# Patient Record
Sex: Male | Born: 1950 | Race: White | Hispanic: No | Marital: Married | Smoking: Never smoker
Health system: Southern US, Community
[De-identification: ages and names within clinical notes are randomized; demographics above are authoritative.]

## PROBLEM LIST (undated history)

## (undated) DIAGNOSIS — N400 Enlarged prostate without lower urinary tract symptoms: Secondary | ICD-10-CM

## (undated) DIAGNOSIS — K5792 Diverticulitis of intestine, part unspecified, without perforation or abscess without bleeding: Secondary | ICD-10-CM

## (undated) DIAGNOSIS — I10 Essential (primary) hypertension: Secondary | ICD-10-CM

## (undated) DIAGNOSIS — C439 Malignant melanoma of skin, unspecified: Secondary | ICD-10-CM

## (undated) HISTORY — PX: HERNIA REPAIR: SHX51

---

## 2018-01-08 ENCOUNTER — Emergency Department (HOSPITAL_COMMUNITY)
Admission: EM | Admit: 2018-01-08 | Discharge: 2018-01-09 | Disposition: A | Discharge status: Home / Self Care | Payer: 59 | Attending: Emergency Medicine | Admitting: Emergency Medicine

## 2018-01-08 ENCOUNTER — Encounter (HOSPITAL_COMMUNITY): Payer: Self-pay | Admitting: Emergency Medicine

## 2018-01-08 ENCOUNTER — Other Ambulatory Visit: Payer: Self-pay

## 2018-01-08 DIAGNOSIS — E876 Hypokalemia: Secondary | ICD-10-CM | POA: Diagnosis not present

## 2018-01-08 DIAGNOSIS — R1013 Epigastric pain: Secondary | ICD-10-CM | POA: Diagnosis not present

## 2018-01-08 DIAGNOSIS — R748 Abnormal levels of other serum enzymes: Secondary | ICD-10-CM

## 2018-01-08 DIAGNOSIS — I10 Essential (primary) hypertension: Secondary | ICD-10-CM | POA: Insufficient documentation

## 2018-01-08 DIAGNOSIS — R1084 Generalized abdominal pain: Secondary | ICD-10-CM

## 2018-01-08 HISTORY — DX: Essential (primary) hypertension: I10

## 2018-01-08 HISTORY — DX: Malignant melanoma of skin, unspecified: C43.9

## 2018-01-08 HISTORY — DX: Diverticulitis of intestine, part unspecified, without perforation or abscess without bleeding: K57.92

## 2018-01-08 HISTORY — DX: Benign prostatic hyperplasia without lower urinary tract symptoms: N40.0

## 2018-01-08 LAB — CBC WITH DIFFERENTIAL/PLATELET
Abs Immature Granulocytes: 0 10*3/uL (ref 0.00–0.07)
BASOS ABS: 0 10*3/uL (ref 0.0–0.1)
BASOS PCT: 0 %
EOS PCT: 4 %
Eosinophils Absolute: 0.1 10*3/uL (ref 0.0–0.5)
HEMATOCRIT: 39.9 % (ref 39.0–52.0)
Hemoglobin: 13.4 g/dL (ref 13.0–17.0)
Immature Granulocytes: 0 %
LYMPHS ABS: 1.6 10*3/uL (ref 0.7–4.0)
Lymphocytes Relative: 42 %
MCH: 31.8 pg (ref 26.0–34.0)
MCHC: 33.6 g/dL (ref 30.0–36.0)
MCV: 94.5 fL (ref 80.0–100.0)
MONOS PCT: 9 %
Monocytes Absolute: 0.3 10*3/uL (ref 0.1–1.0)
NEUTROS PCT: 45 %
NRBC: 0 % (ref 0.0–0.2)
Neutro Abs: 1.7 10*3/uL (ref 1.7–7.7)
Platelets: 149 10*3/uL — ABNORMAL LOW (ref 150–400)
RBC: 4.22 MIL/uL (ref 4.22–5.81)
RDW: 12.6 % (ref 11.5–15.5)
WBC: 3.8 10*3/uL — ABNORMAL LOW (ref 4.0–10.5)

## 2018-01-08 LAB — URINALYSIS, ROUTINE W REFLEX MICROSCOPIC
Bilirubin Urine: NEGATIVE
Glucose, UA: NEGATIVE mg/dL
Hgb urine dipstick: NEGATIVE
KETONES UR: NEGATIVE mg/dL
Leukocytes, UA: NEGATIVE
Nitrite: NEGATIVE
PROTEIN: NEGATIVE mg/dL
Specific Gravity, Urine: 1.012 (ref 1.005–1.030)
pH: 6 (ref 5.0–8.0)

## 2018-01-08 NOTE — ED Provider Notes (Signed)
[facility] EMERGENCY DEPARTMENT Provider Note   CSN: [id number] Arrival date & time: 01/08/18  2203    History   Chief Complaint Chief Complaint  Patient presents with  . Abdominal Pain    HPI Tony Hughes is a 67 y.o. male who presents with abdominal pain. PMH significant for diverticulosis, HTN. Past surgical hx significant for bilateral inguinal hernia repair. The patient states that about 1 hour ago he started to have a pain around his middle. It felt tight "like a band". It was very uncomfortable and worried him so he decided to come to the ED. As soon as he got here and had an EKG done the pain eased off. He states that he had a similar pain a couple weeks ago which was milder. He had some nausea but not vomiting. He had a bowel movement and this did not improve his pain. He denies lightheadedness, chest pain, SOB, current abdominal pain, urinary symptoms.  HPI  Past Medical History:  Diagnosis Date  . Diverticulitis   . Hypertension     There are no active problems to display for this patient.   Past Surgical History:  Procedure Laterality Date  . HERNIA REPAIR          Home Medications    Prior to Admission medications   Not on File    Family History No family history on file.  Social History Social History   Tobacco Use  . Smoking status: Never Smoker  . Smokeless tobacco: Never Used  Substance Use Topics  . Alcohol use: Yes  . Drug use: Never     Allergies   Patient has no known allergies.   Review of Systems Review of Systems  Constitutional: Negative for fever.  Respiratory: Negative for shortness of breath.   Cardiovascular: Negative for chest pain.  Gastrointestinal: Positive for abdominal pain and nausea. Negative for constipation, diarrhea and vomiting.  Genitourinary: Negative for dysuria.  Neurological: Negative for syncope and light-headedness.  All other systems reviewed and are  negative.    Physical Exam Updated Vital Signs BP (!) 157/96   Pulse 61   Temp 98.5 F (36.9 C) (Oral)   Resp 16   SpO2 100%   Physical Exam  Constitutional: He is oriented to person, place, and time. He appears well-developed and well-nourished. No distress.  Cooperative. Mildly anxious  HENT:  Head: Normocephalic and atraumatic.  Eyes: Pupils are equal, round, and reactive to light. Conjunctivae are normal. Right eye exhibits no discharge. Left eye exhibits no discharge. No scleral icterus.  Neck: Normal range of motion.  Cardiovascular: Normal rate and regular rhythm.  Pulmonary/Chest: Effort normal and breath sounds normal. No respiratory distress.  Abdominal: Soft. Bowel sounds are normal. He exhibits no distension and no mass. There is no tenderness. There is no rebound and no guarding. No hernia.  Neurological: He is alert and oriented to person, place, and time.  Skin: Skin is warm and dry.  Psychiatric: He has a normal mood and affect. His behavior is normal.  Nursing note and vitals reviewed.    ED Treatments / Results  Labs (all labs ordered are listed, but only abnormal results are displayed) Labs Reviewed  CBC WITH DIFFERENTIAL/PLATELET - Abnormal; Notable for the following components:      Result Value   WBC 3.8 (*)    Platelets 149 (*)    All other components within normal limits  COMPREHENSIVE METABOLIC PANEL - Abnormal; Notable for the following components:  Potassium 3.2 (*)    Glucose, Bld 111 (*)    Calcium 8.8 (*)    Total Protein 6.1 (*)    AST 146 (*)    ALT 99 (*)    All other components within normal limits  LIPASE, BLOOD  URINALYSIS, ROUTINE W REFLEX MICROSCOPIC  TROPONIN I    EKG EKG Interpretation  Date/Time:  Friday January 08 2018 22:14:27 EST Ventricular Rate:  67 PR Interval:  172 QRS Duration: 100 QT Interval:  412 QTC Calculation: 435 R Axis:   29 Text Interpretation:  Sinus rhythm with Premature atrial complexes  Incomplete right bundle branch block Borderline ECG No old tracing to compare Confirmed by Deno Etienne [id number]) on 01/09/2018 1:16:06 AM   Radiology Ct Abdomen Pelvis W Contrast  Result Date: 01/09/2018 CLINICAL DATA:  Acute onset of epigastric abdominal pain and nausea. EXAM: CT ABDOMEN AND PELVIS WITH CONTRAST TECHNIQUE: Multidetector CT imaging of the abdomen and pelvis was performed using the standard protocol following bolus administration of intravenous contrast. CONTRAST:  123mL OMNIPAQUE IOHEXOL 300 MG/ML  SOLN COMPARISON:  None. FINDINGS: Lower chest: The visualized lung bases are grossly clear. The visualized portions of the mediastinum are unremarkable. Hepatobiliary: The liver is unremarkable in appearance. The gallbladder is unremarkable in appearance. The common bile duct remains normal in caliber. Pancreas: The pancreas is within normal limits. Spleen: The spleen is unremarkable in appearance. Adrenals/Urinary Tract: The adrenal glands are unremarkable in appearance. The kidneys are within normal limits. There is no evidence of hydronephrosis. No renal or ureteral stones are identified. No perinephric stranding is seen. Stomach/Bowel: Mild diverticulosis is noted along the descending and sigmoid colon, without evidence of diverticulitis. Vascular/Lymphatic: The abdominal aorta is unremarkable in appearance. The inferior vena cava is grossly unremarkable. No retroperitoneal lymphadenopathy is seen. No pelvic sidewall lymphadenopathy is identified. Reproductive: The bladder is mildly distended and grossly unremarkable. The prostate is enlarged, measuring 5.4 cm in transverse dimension. Other: No additional soft tissue abnormalities are seen. Musculoskeletal: No acute osseous abnormalities are identified. The visualized musculature is unremarkable in appearance. IMPRESSION: 1. No acute abnormality seen within the abdomen or pelvis. 2. Mild diverticulosis along the descending and sigmoid colon, without  evidence of diverticulitis. 3. Enlarged prostate noted. Electronically Signed   By: Garald Balding M.D.   On: 01/09/2018 01:08    Procedures Procedures (including critical care time)  Medications Ordered in ED Medications  iohexol (OMNIPAQUE) 300 MG/ML solution 100 mL (100 mLs Intravenous Contrast Given 01/09/18 0024)     Initial Impression / Assessment and Plan / ED Course  I have reviewed the triage vital signs and the nursing notes.  Pertinent labs & imaging results that were available during my care of the patient were reviewed by me and considered in my medical decision making (see chart for details).  67 year old male presents with generalized abdominal pain earlier tonight. He is hypertensive but otherwise vitals are normal. On my initial evaluation he is essentially pain free. EKG shows SR with PACs and RBBB. Will obtain labs, UA, and CT Abdomen/pelvis. Discussed with attending Dr. Zenia Resides.  CBC is remarkable for mild leukopenia. CMP is remarkable for mild hypokalemia, hypocalcemia, low protein, and mildly elevated liver enzymes. He states he drinks about a bottle of wine a week. No excessive Tylenol usage. No hx of hepatitis. I do not have a recent value to compare but he states he has a physical in September and blood work was normal at that time. CT was  negative for acute process. He was given a copy of his results and encouraged to f/u with his PCP. Return precautions given.   Final Clinical Impressions(s) / ED Diagnoses   Final diagnoses:  Generalized abdominal pain  Elevated liver enzymes  Hypokalemia    ED Discharge Orders    None       Recardo Evangelist, PA-C 01/09/18 [facility]    Lacretia Leigh, MD 01/09/18 [phone]

## 2018-01-08 NOTE — ED Triage Notes (Signed)
Pt c/o epigastric pain that started tonight. Endorses some nausea, denies vomiting/diarrhea. Hx diverticulitis and hernia repair.

## 2018-01-09 ENCOUNTER — Emergency Department (HOSPITAL_COMMUNITY): Payer: 59

## 2018-01-09 LAB — COMPREHENSIVE METABOLIC PANEL
ALT: 99 U/L — AB (ref 0–44)
AST: 146 U/L — AB (ref 15–41)
Albumin: 3.8 g/dL (ref 3.5–5.0)
Alkaline Phosphatase: 41 U/L (ref 38–126)
Anion gap: 8 (ref 5–15)
BILIRUBIN TOTAL: 0.7 mg/dL (ref 0.3–1.2)
BUN: 14 mg/dL (ref 8–23)
CHLORIDE: 105 mmol/L (ref 98–111)
CO2: 26 mmol/L (ref 22–32)
CREATININE: 1.01 mg/dL (ref 0.61–1.24)
Calcium: 8.8 mg/dL — ABNORMAL LOW (ref 8.9–10.3)
Glucose, Bld: 111 mg/dL — ABNORMAL HIGH (ref 70–99)
POTASSIUM: 3.2 mmol/L — AB (ref 3.5–5.1)
Sodium: 139 mmol/L (ref 135–145)
TOTAL PROTEIN: 6.1 g/dL — AB (ref 6.5–8.1)

## 2018-01-09 LAB — TROPONIN I

## 2018-01-09 LAB — LIPASE, BLOOD: LIPASE: 34 U/L (ref 11–51)

## 2018-01-09 MED ORDER — IOHEXOL 300 MG/ML  SOLN
100.0000 mL | Freq: Once | INTRAMUSCULAR | Status: AC | PRN
  Administered 2018-01-09: 100 mL via INTRAVENOUS

## 2018-01-09 NOTE — Discharge Instructions (Signed)
Please follow up with your doctor regarding test results tonight] Return if you are worsening

## 2018-01-09 NOTE — ED Notes (Signed)
Patient verbalizes understanding of discharge instructions. Opportunity for questioning and answers were provided. Armband removed by staff, pt discharged from ED, pt ambulatory with spouse to lobby.

## 2019-12-03 IMAGING — CT CT ABD-PELV W/ CM
2 of 5 series · 16 of 46 positions shown, 18 images · IV contrast (Omni 300)
Comparison: None.

CLINICAL DATA: Acute onset of epigastric abdominal pain and nausea.

EXAM:
CT ABDOMEN AND PELVIS WITH CONTRAST
TECHNIQUE: Multidetector CT imaging of the abdomen and pelvis was performed
using the standard protocol following bolus administration of
intravenous contrast.
CONTRAST:  100mL OMNIPAQUE IOHEXOL 300 MG/ML  SOLN

[Series 3: a/p w/ 5mm · axial · 0.93mm/px · z∈[+913,+1368]mm · 13 of 103 slices shown, 15 images]
[im 6/103  soft-tissue]
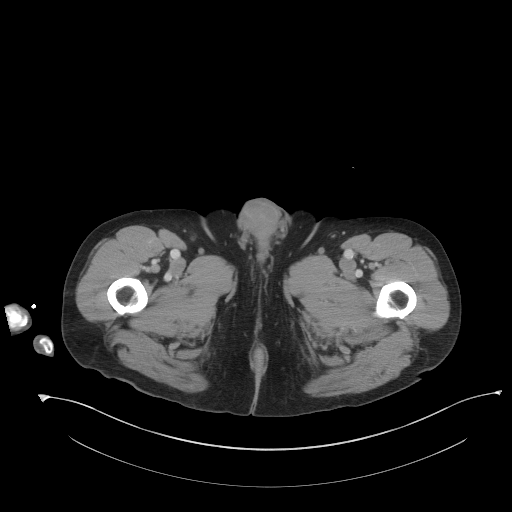
[im 6/103  bone]
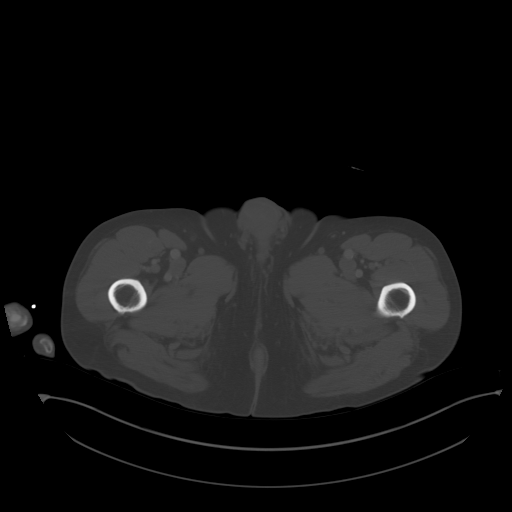
[im 17/103  soft-tissue]
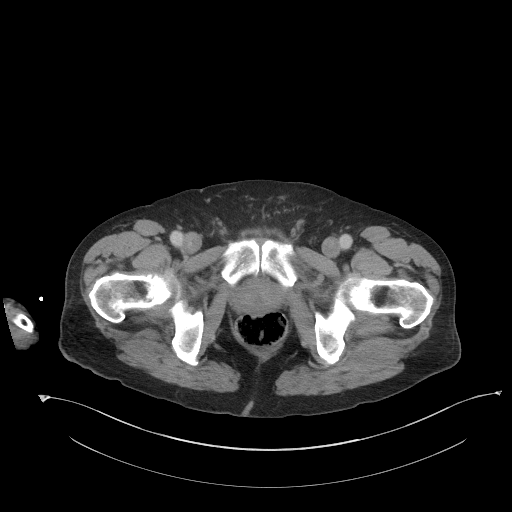
[im 22/103  soft-tissue]
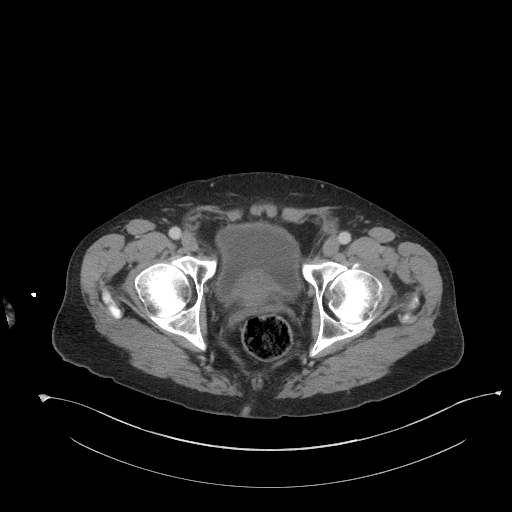
[im 27/103  soft-tissue]
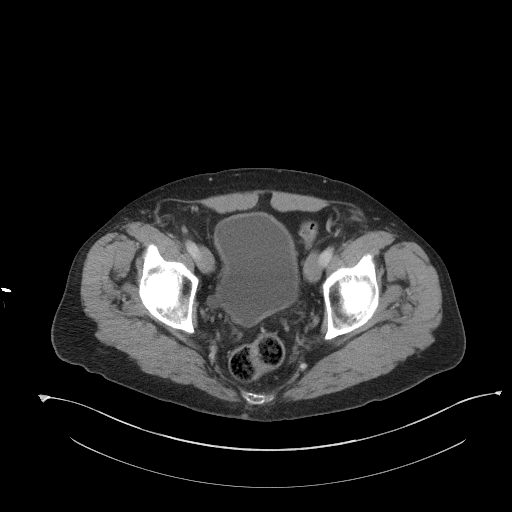
[im 38/103  soft-tissue]
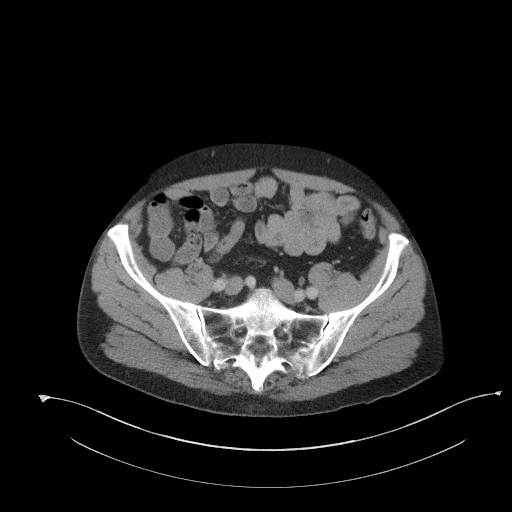
[im 43/103  soft-tissue]
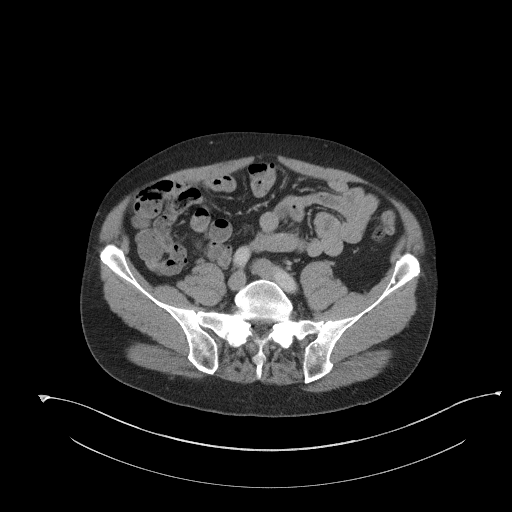
[im 54/103  soft-tissue]
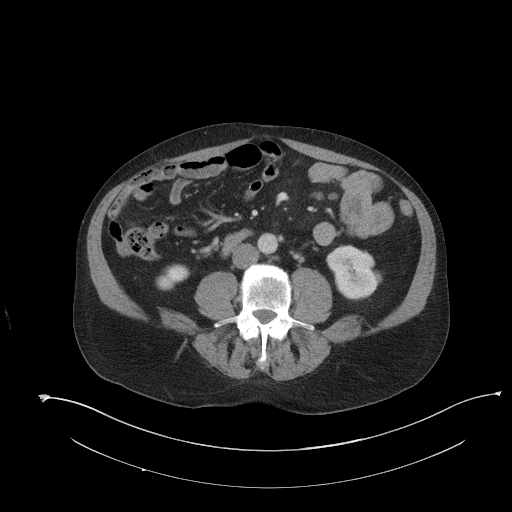
[im 60/103  soft-tissue]
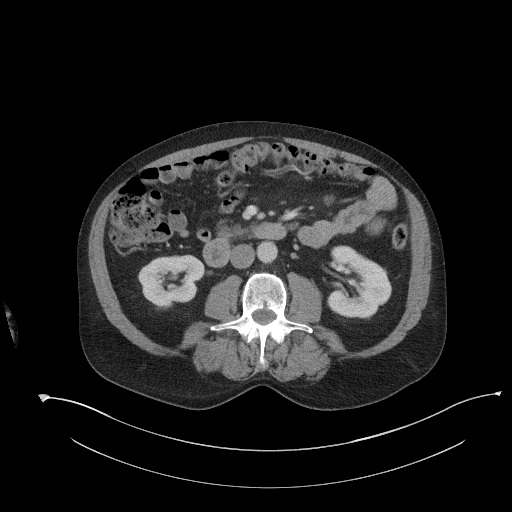
[im 65/103  soft-tissue]
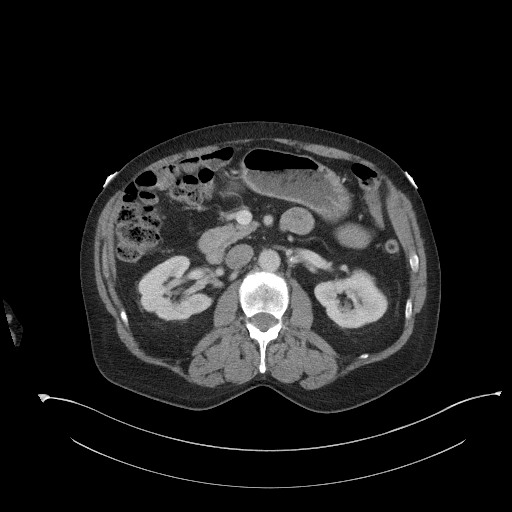
[im 65/103  bone]
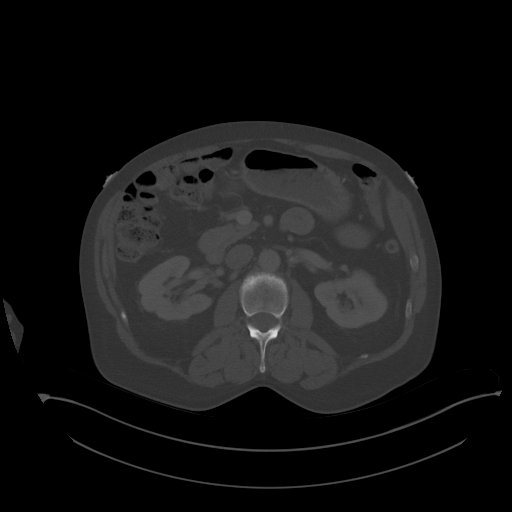
[im 76/103  soft-tissue]
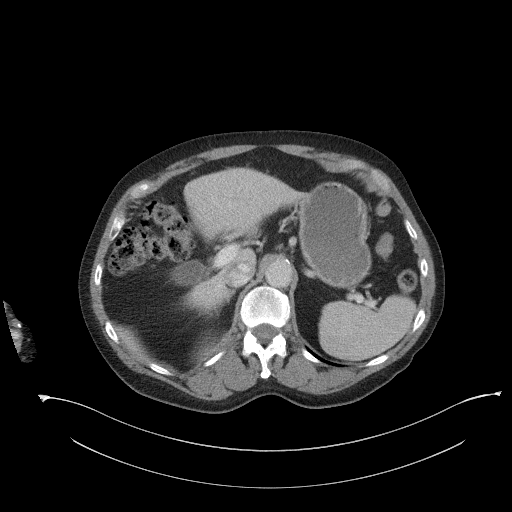
[im 81/103  soft-tissue]
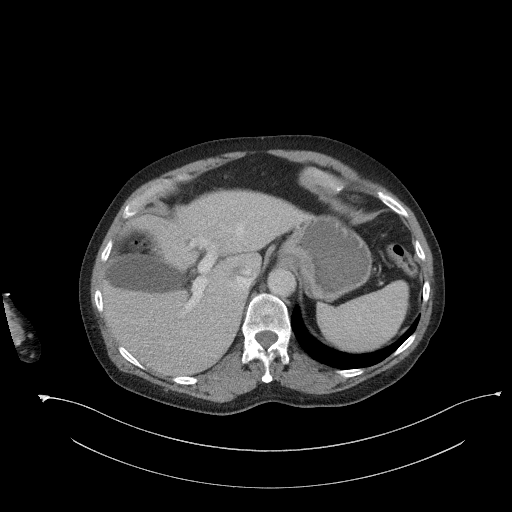
[im 86/103  soft-tissue]
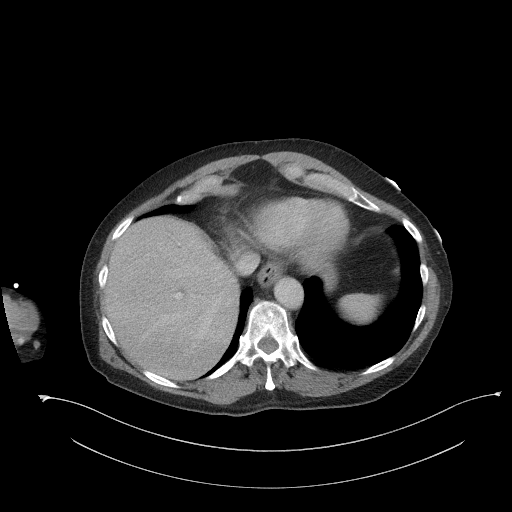
[im 97/103  soft-tissue]
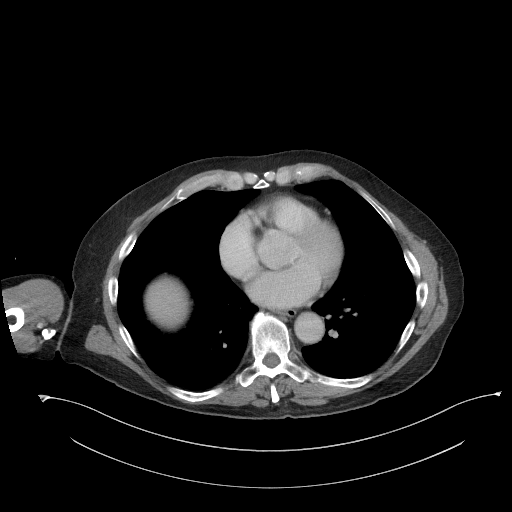

[Series 6: a/p w/ cor · coronal · 0.86mm/px · 3 of 148 slices shown]
[im 50/148  soft-tissue]
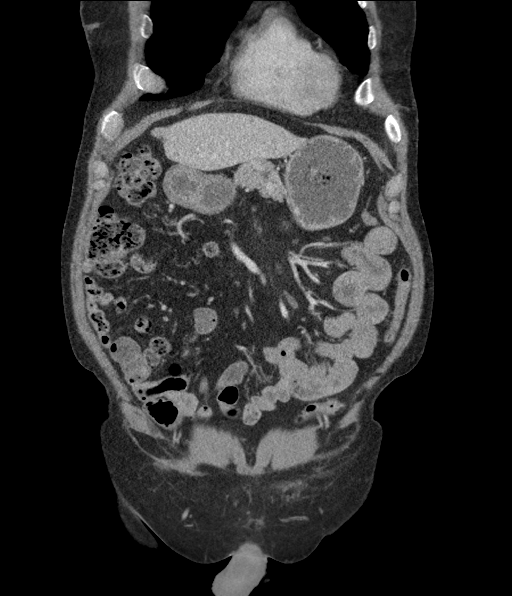
[im 66/148  soft-tissue]
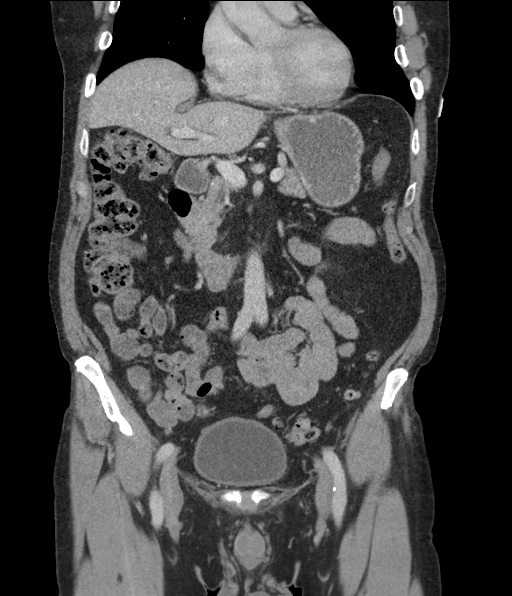
[im 82/148  soft-tissue]
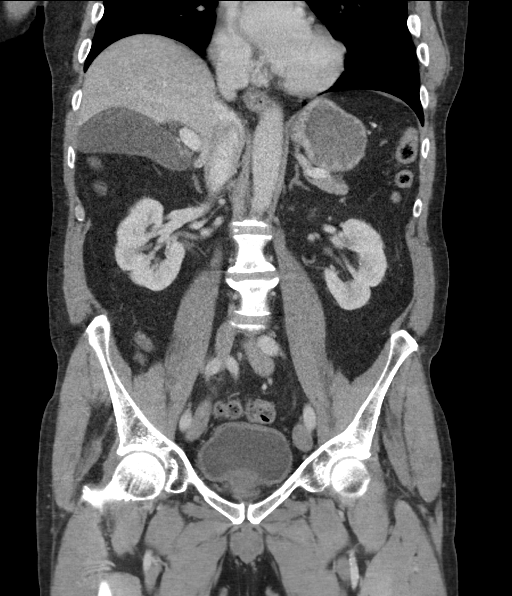

[16 of 46 positions shown; findings below may reference images not displayed]

FINDINGS: Lower chest: The visualized lung bases are grossly clear. The
visualized portions of the mediastinum are unremarkable.

Hepatobiliary: The liver is unremarkable in appearance. The
gallbladder is unremarkable in appearance. The common bile duct
remains normal in caliber.

Pancreas: The pancreas is within normal limits.

Spleen: The spleen is unremarkable in appearance.

Adrenals/Urinary Tract: The adrenal glands are unremarkable in
appearance. The kidneys are within normal limits. There is no
evidence of hydronephrosis. No renal or ureteral stones are
identified. No perinephric stranding is seen.

Stomach/Bowel: Mild diverticulosis is noted along the descending and
sigmoid colon, without evidence of diverticulitis.

Vascular/Lymphatic: The abdominal aorta is unremarkable in
appearance. The inferior vena cava is grossly unremarkable. No
retroperitoneal lymphadenopathy is seen. No pelvic sidewall
lymphadenopathy is identified.

Reproductive: The bladder is mildly distended and grossly
unremarkable. The prostate is enlarged, measuring 5.4 cm in
transverse dimension.

Other: No additional soft tissue abnormalities are seen.

Musculoskeletal: No acute osseous abnormalities are identified. The
visualized musculature is unremarkable in appearance.
IMPRESSION: 1. No acute abnormality seen within the abdomen or pelvis.
2. Mild diverticulosis along the descending and sigmoid colon,
without evidence of diverticulitis.
3. Enlarged prostate noted.
